# Patient Record
Sex: Female | Born: 1960 | Race: White | Hispanic: No | Marital: Married | Smoking: Current every day smoker
Health system: Southern US, Community
[De-identification: ages and names within clinical notes are randomized; demographics above are authoritative.]

## PROBLEM LIST (undated history)

## (undated) DIAGNOSIS — R112 Nausea with vomiting, unspecified: Secondary | ICD-10-CM

## (undated) DIAGNOSIS — E041 Nontoxic single thyroid nodule: Secondary | ICD-10-CM

## (undated) DIAGNOSIS — M1990 Unspecified osteoarthritis, unspecified site: Secondary | ICD-10-CM

## (undated) DIAGNOSIS — Z9889 Other specified postprocedural states: Secondary | ICD-10-CM

## (undated) DIAGNOSIS — M19012 Primary osteoarthritis, left shoulder: Secondary | ICD-10-CM

## (undated) HISTORY — PX: CHOLECYSTECTOMY: SHX55

## (undated) HISTORY — PX: SHOULDER ARTHROSCOPY: SHX128

## (undated) HISTORY — PX: JOINT REPLACEMENT: SHX530

## (undated) HISTORY — PX: ABDOMINAL HYSTERECTOMY: SHX81

## (undated) HISTORY — PX: BREAST SURGERY: SHX581

---

## 1999-01-27 ENCOUNTER — Emergency Department (HOSPITAL_COMMUNITY): Admission: EM | Admit: 1999-01-27 | Discharge: 1999-01-27 | Payer: Self-pay

## 2000-01-27 ENCOUNTER — Other Ambulatory Visit: Admission: RE | Admit: 2000-01-27 | Discharge: 2000-01-27 | Payer: Self-pay | Admitting: Family Medicine

## 2015-11-17 ENCOUNTER — Other Ambulatory Visit: Payer: Self-pay | Admitting: Orthopedic Surgery

## 2015-11-17 DIAGNOSIS — M25512 Pain in left shoulder: Secondary | ICD-10-CM

## 2015-11-17 DIAGNOSIS — M19012 Primary osteoarthritis, left shoulder: Secondary | ICD-10-CM

## 2015-11-21 ENCOUNTER — Other Ambulatory Visit: Payer: Self-pay

## 2015-12-22 ENCOUNTER — Ambulatory Visit
Admission: RE | Admit: 2015-12-22 | Discharge: 2015-12-22 | Disposition: A | Discharge status: Home / Self Care | Payer: Worker's Compensation | Source: Ambulatory Visit | Attending: Orthopedic Surgery | Admitting: Orthopedic Surgery

## 2015-12-22 DIAGNOSIS — M19012 Primary osteoarthritis, left shoulder: Secondary | ICD-10-CM

## 2015-12-22 DIAGNOSIS — M25512 Pain in left shoulder: Secondary | ICD-10-CM

## 2016-01-13 ENCOUNTER — Other Ambulatory Visit: Payer: Self-pay | Admitting: Orthopedic Surgery

## 2016-01-16 ENCOUNTER — Encounter (HOSPITAL_BASED_OUTPATIENT_CLINIC_OR_DEPARTMENT_OTHER): Payer: Self-pay | Admitting: *Deleted

## 2016-01-16 ENCOUNTER — Encounter (HOSPITAL_BASED_OUTPATIENT_CLINIC_OR_DEPARTMENT_OTHER)
Admission: RE | Admit: 2016-01-16 | Discharge: 2016-01-16 | Disposition: A | Discharge status: Home / Self Care | Payer: Self-pay | Source: Ambulatory Visit | Attending: Orthopedic Surgery | Admitting: Orthopedic Surgery

## 2016-01-16 DIAGNOSIS — M19012 Primary osteoarthritis, left shoulder: Secondary | ICD-10-CM | POA: Insufficient documentation

## 2016-01-16 DIAGNOSIS — Z01812 Encounter for preprocedural laboratory examination: Secondary | ICD-10-CM | POA: Insufficient documentation

## 2016-01-16 DIAGNOSIS — Z01818 Encounter for other preprocedural examination: Secondary | ICD-10-CM | POA: Insufficient documentation

## 2016-01-16 LAB — BASIC METABOLIC PANEL
ANION GAP: 7 (ref 5–15)
BUN: 10 mg/dL (ref 6–20)
CHLORIDE: 101 mmol/L (ref 101–111)
CO2: 29 mmol/L (ref 22–32)
Calcium: 9.2 mg/dL (ref 8.9–10.3)
Creatinine, Ser: 0.94 mg/dL (ref 0.44–1.00)
GFR calc Af Amer: >60 mL/min (ref >60)
Glucose, Bld: 95 mg/dL (ref 65–99)
POTASSIUM: 3.9 mmol/L (ref 3.5–5.1)
SODIUM: 137 mmol/L (ref 135–145)

## 2016-01-16 LAB — CBC
HEMATOCRIT: 37.4 % (ref 36.0–46.0)
HEMOGLOBIN: 12.2 g/dL (ref 12.0–15.0)
MCH: 30 pg (ref 26.0–34.0)
MCHC: 32.6 g/dL (ref 30.0–36.0)
MCV: 92.1 fL (ref 78.0–100.0)
Platelets: 290 10*3/uL (ref 150–400)
RBC: 4.06 MIL/uL (ref 3.87–5.11)
RDW: 12.7 % (ref 11.5–15.5)
WBC: 10.2 10*3/uL (ref 4.0–10.5)

## 2016-01-16 LAB — SURGICAL PCR SCREEN
MRSA, PCR: NEGATIVE
STAPHYLOCOCCUS AUREUS: NEGATIVE

## 2016-01-30 ENCOUNTER — Encounter (HOSPITAL_BASED_OUTPATIENT_CLINIC_OR_DEPARTMENT_OTHER)
Admission: RE | Disposition: A | Discharge status: Home / Self Care | Payer: Self-pay | Source: Ambulatory Visit | Attending: Orthopedic Surgery

## 2016-01-30 ENCOUNTER — Encounter (HOSPITAL_BASED_OUTPATIENT_CLINIC_OR_DEPARTMENT_OTHER): Payer: Self-pay | Admitting: Anesthesiology

## 2016-01-30 ENCOUNTER — Ambulatory Visit (HOSPITAL_BASED_OUTPATIENT_CLINIC_OR_DEPARTMENT_OTHER): Payer: Worker's Compensation | Admitting: Anesthesiology

## 2016-01-30 ENCOUNTER — Ambulatory Visit (HOSPITAL_BASED_OUTPATIENT_CLINIC_OR_DEPARTMENT_OTHER)
Admission: RE | Admit: 2016-01-30 | Discharge: 2016-01-31 | Disposition: A | Discharge status: Home / Self Care | Payer: Worker's Compensation | Source: Ambulatory Visit | Attending: Orthopedic Surgery | Admitting: Orthopedic Surgery

## 2016-01-30 ENCOUNTER — Ambulatory Visit (HOSPITAL_COMMUNITY): Payer: Worker's Compensation

## 2016-01-30 DIAGNOSIS — F17210 Nicotine dependence, cigarettes, uncomplicated: Secondary | ICD-10-CM | POA: Diagnosis not present

## 2016-01-30 DIAGNOSIS — M19012 Primary osteoarthritis, left shoulder: Secondary | ICD-10-CM | POA: Diagnosis present

## 2016-01-30 DIAGNOSIS — Z96619 Presence of unspecified artificial shoulder joint: Secondary | ICD-10-CM

## 2016-01-30 HISTORY — DX: Nausea with vomiting, unspecified: R11.2

## 2016-01-30 HISTORY — PX: TOTAL SHOULDER ARTHROPLASTY: SHX126

## 2016-01-30 HISTORY — DX: Other specified postprocedural states: Z98.890

## 2016-01-30 HISTORY — DX: Primary osteoarthritis, left shoulder: M19.012

## 2016-01-30 HISTORY — DX: Nontoxic single thyroid nodule: E04.1

## 2016-01-30 HISTORY — DX: Unspecified osteoarthritis, unspecified site: M19.90

## 2016-01-30 SURGERY — ARTHROPLASTY, SHOULDER, TOTAL
Anesthesia: General | Site: Shoulder | Laterality: Left

## 2016-01-30 MED ORDER — FENTANYL CITRATE (PF) 100 MCG/2ML IJ SOLN
INTRAMUSCULAR | Status: AC
  Filled 2016-01-30: qty 2

## 2016-01-30 MED ORDER — SENNA 8.6 MG PO TABS
1.0000 | ORAL_TABLET | Freq: Two times a day (BID) | ORAL | Status: DC
  Administered 2016-01-30: 8.6 mg via ORAL
  Filled 2016-01-30: qty 1

## 2016-01-30 MED ORDER — DOCUSATE SODIUM 100 MG PO CAPS
100.0000 mg | ORAL_CAPSULE | Freq: Two times a day (BID) | ORAL | Status: DC
  Administered 2016-01-30: 100 mg via ORAL
  Filled 2016-01-30: qty 1

## 2016-01-30 MED ORDER — POLYETHYLENE GLYCOL 3350 17 G PO PACK: 17.0000 g | PACK | Freq: Every day | ORAL | Status: DC | PRN

## 2016-01-30 MED ORDER — PHENYLEPHRINE HCL 10 MG/ML IJ SOLN
10.0000 mg | INTRAVENOUS | Status: DC | PRN
  Administered 2016-01-30: 50 ug/min via INTRAVENOUS

## 2016-01-30 MED ORDER — ADULT MULTIVITAMIN W/MINERALS CH: 1.0000 | ORAL_TABLET | Freq: Every day | ORAL | Status: DC

## 2016-01-30 MED ORDER — ONDANSETRON HCL 4 MG/2ML IJ SOLN
INTRAMUSCULAR | Status: AC
  Filled 2016-01-30: qty 2

## 2016-01-30 MED ORDER — SCOPOLAMINE 1 MG/3DAYS TD PT72
MEDICATED_PATCH | TRANSDERMAL | Status: AC
  Filled 2016-01-30: qty 1

## 2016-01-30 MED ORDER — HYDROCODONE-ACETAMINOPHEN 5-325 MG PO TABS
1.0000 | ORAL_TABLET | Freq: Four times a day (QID) | ORAL | Status: DC | PRN
  Administered 2016-01-30 – 2016-01-31 (×2): 1 via ORAL
  Filled 2016-01-30 (×2): qty 1

## 2016-01-30 MED ORDER — ONDANSETRON HCL 4 MG/2ML IJ SOLN
INTRAMUSCULAR | Status: DC | PRN
  Administered 2016-01-30: 4 mg via INTRAVENOUS

## 2016-01-30 MED ORDER — LACTATED RINGERS IV SOLN
INTRAVENOUS | Status: DC
  Administered 2016-01-30: 07:00:00 via INTRAVENOUS

## 2016-01-30 MED ORDER — CEFAZOLIN SODIUM 1-5 GM-% IV SOLN
1.0000 g | Freq: Four times a day (QID) | INTRAVENOUS | Status: AC
  Administered 2016-01-30 – 2016-01-31 (×3): 1 g via INTRAVENOUS
  Filled 2016-01-30 (×3): qty 50

## 2016-01-30 MED ORDER — SODIUM CHLORIDE 0.9 % IR SOLN
Status: DC | PRN
  Administered 2016-01-30: 1000 mL

## 2016-01-30 MED ORDER — CEFAZOLIN SODIUM-DEXTROSE 2-3 GM-% IV SOLR
2.0000 g | INTRAVENOUS | Status: AC
  Administered 2016-01-30: 2 g via INTRAVENOUS

## 2016-01-30 MED ORDER — SODIUM CHLORIDE FLUSH 0.9 % IV SOLN
INTRAVENOUS | Status: AC
  Filled 2016-01-30: qty 10

## 2016-01-30 MED ORDER — MAGNESIUM CITRATE PO SOLN: 1.0000 | Freq: Once | ORAL | Status: DC | PRN

## 2016-01-30 MED ORDER — HYDROCODONE-ACETAMINOPHEN 5-325 MG PO TABS: 1.0000 | ORAL_TABLET | Freq: Four times a day (QID) | ORAL | Status: AC | PRN

## 2016-01-30 MED ORDER — DEXAMETHASONE SODIUM PHOSPHATE 4 MG/ML IJ SOLN
INTRAMUSCULAR | Status: DC | PRN
  Administered 2016-01-30: 10 mg via INTRAVENOUS

## 2016-01-30 MED ORDER — HYDROMORPHONE HCL 1 MG/ML IJ SOLN: 0.2500 mg | INTRAMUSCULAR | Status: DC | PRN

## 2016-01-30 MED ORDER — MEPERIDINE HCL 25 MG/ML IJ SOLN: 6.2500 mg | INTRAMUSCULAR | Status: DC | PRN

## 2016-01-30 MED ORDER — METHOCARBAMOL 1000 MG/10ML IJ SOLN: 500.0000 mg | Freq: Four times a day (QID) | INTRAVENOUS | Status: DC | PRN

## 2016-01-30 MED ORDER — ZOLPIDEM TARTRATE 5 MG PO TABS: 5.0000 mg | ORAL_TABLET | Freq: Every evening | ORAL | Status: DC | PRN

## 2016-01-30 MED ORDER — HYDROMORPHONE HCL 1 MG/ML IJ SOLN: 0.5000 mg | INTRAMUSCULAR | Status: DC | PRN

## 2016-01-30 MED ORDER — BISACODYL 10 MG RE SUPP: 10.0000 mg | Freq: Every day | RECTAL | Status: DC | PRN

## 2016-01-30 MED ORDER — DEXAMETHASONE SODIUM PHOSPHATE 10 MG/ML IJ SOLN
INTRAMUSCULAR | Status: AC
  Filled 2016-01-30: qty 1

## 2016-01-30 MED ORDER — GLYCOPYRROLATE 0.2 MG/ML IJ SOLN: 0.2000 mg | Freq: Once | INTRAMUSCULAR | Status: DC | PRN

## 2016-01-30 MED ORDER — ONDANSETRON HCL 4 MG PO TABS: 4.0000 mg | ORAL_TABLET | Freq: Three times a day (TID) | ORAL | Status: AC | PRN

## 2016-01-30 MED ORDER — MIDAZOLAM HCL 2 MG/2ML IJ SOLN
INTRAMUSCULAR | Status: AC
  Filled 2016-01-30: qty 2

## 2016-01-30 MED ORDER — SUCCINYLCHOLINE CHLORIDE 20 MG/ML IJ SOLN
INTRAMUSCULAR | Status: AC
  Filled 2016-01-30: qty 1

## 2016-01-30 MED ORDER — FENTANYL CITRATE (PF) 100 MCG/2ML IJ SOLN: 50.0000 ug | INTRAMUSCULAR | Status: DC | PRN

## 2016-01-30 MED ORDER — SUCCINYLCHOLINE CHLORIDE 20 MG/ML IJ SOLN
INTRAMUSCULAR | Status: DC | PRN
  Administered 2016-01-30: 100 mg via INTRAVENOUS

## 2016-01-30 MED ORDER — RIVAROXABAN 15 MG PO TABS: 15.0000 mg | ORAL_TABLET | Freq: Every day | ORAL | Status: AC

## 2016-01-30 MED ORDER — ONDANSETRON HCL 4 MG/2ML IJ SOLN: 4.0000 mg | Freq: Four times a day (QID) | INTRAMUSCULAR | Status: DC | PRN

## 2016-01-30 MED ORDER — PROPOFOL 500 MG/50ML IV EMUL
INTRAVENOUS | Status: AC
  Filled 2016-01-30: qty 50

## 2016-01-30 MED ORDER — PROPOFOL 10 MG/ML IV BOLUS
INTRAVENOUS | Status: DC | PRN
  Administered 2016-01-30: 20 mg via INTRAVENOUS
  Administered 2016-01-30: 150 mg via INTRAVENOUS
  Administered 2016-01-30: 20 mg via INTRAVENOUS

## 2016-01-30 MED ORDER — METHOCARBAMOL 500 MG PO TABS: 500.0000 mg | ORAL_TABLET | Freq: Four times a day (QID) | ORAL | Status: DC | PRN

## 2016-01-30 MED ORDER — METOCLOPRAMIDE HCL 5 MG/ML IJ SOLN: 5.0000 mg | Freq: Three times a day (TID) | INTRAMUSCULAR | Status: DC | PRN

## 2016-01-30 MED ORDER — METOCLOPRAMIDE HCL 5 MG PO TABS: 5.0000 mg | ORAL_TABLET | Freq: Three times a day (TID) | ORAL | Status: DC | PRN

## 2016-01-30 MED ORDER — CEFAZOLIN SODIUM-DEXTROSE 2-3 GM-% IV SOLR
INTRAVENOUS | Status: AC
  Filled 2016-01-30: qty 50

## 2016-01-30 MED ORDER — ONDANSETRON HCL 4 MG PO TABS: 4.0000 mg | ORAL_TABLET | Freq: Four times a day (QID) | ORAL | Status: DC | PRN

## 2016-01-30 MED ORDER — MIDAZOLAM HCL 2 MG/2ML IJ SOLN
1.0000 mg | INTRAMUSCULAR | Status: DC | PRN
  Administered 2016-01-30: 2 mg via INTRAVENOUS

## 2016-01-30 MED ORDER — CALCIUM CARBONATE 1250 (500 CA) MG PO CHEW: 1250.0000 mg | CHEWABLE_TABLET | Freq: Every day | ORAL | Status: DC

## 2016-01-30 MED ORDER — PHENYLEPHRINE HCL 10 MG/ML IJ SOLN
INTRAMUSCULAR | Status: AC
  Filled 2016-01-30: qty 1

## 2016-01-30 MED ORDER — LIDOCAINE HCL (CARDIAC) 20 MG/ML IV SOLN
INTRAVENOUS | Status: AC
  Filled 2016-01-30: qty 5

## 2016-01-30 MED ORDER — FENTANYL CITRATE (PF) 100 MCG/2ML IJ SOLN
25.0000 ug | INTRAMUSCULAR | Status: DC | PRN
  Administered 2016-01-30 (×2): 25 ug via INTRAVENOUS

## 2016-01-30 MED ORDER — SENNA-DOCUSATE SODIUM 8.6-50 MG PO TABS: 2.0000 | ORAL_TABLET | Freq: Every day | ORAL | Status: AC

## 2016-01-30 MED ORDER — SCOPOLAMINE 1 MG/3DAYS TD PT72
1.0000 | MEDICATED_PATCH | Freq: Once | TRANSDERMAL | Status: AC | PRN
  Administered 2016-01-30: 1 via TRANSDERMAL

## 2016-01-30 MED ORDER — FENTANYL CITRATE (PF) 100 MCG/2ML IJ SOLN
50.0000 ug | INTRAMUSCULAR | Status: AC | PRN
  Administered 2016-01-30: 25 ug via INTRAVENOUS
  Administered 2016-01-30: 100 ug via INTRAVENOUS
  Administered 2016-01-30: 50 ug via INTRAVENOUS

## 2016-01-30 MED ORDER — ONDANSETRON HCL 4 MG/2ML IJ SOLN: 4.0000 mg | Freq: Once | INTRAMUSCULAR | Status: DC | PRN

## 2016-01-30 MED ORDER — LIDOCAINE HCL (CARDIAC) 20 MG/ML IV SOLN
INTRAVENOUS | Status: DC | PRN
  Administered 2016-01-30: 100 mg via INTRAVENOUS

## 2016-01-30 MED ORDER — BACLOFEN 10 MG PO TABS: 10.0000 mg | ORAL_TABLET | Freq: Three times a day (TID) | ORAL | Status: AC

## 2016-01-30 MED ORDER — SODIUM CHLORIDE 0.9 % IV SOLN: INTRAVENOUS | Status: DC

## 2016-01-30 SURGICAL SUPPLY — 77 items
BLADE HEX COATED 2.75 (ELECTRODE) ×3 IMPLANT
BLADE SAW SAG 29X58X.64 (BLADE) ×3 IMPLANT
BLADE SURG 10 STRL SS (BLADE) ×3 IMPLANT
BLADE SURG 15 STRL LF DISP TIS (BLADE) ×2 IMPLANT
BLADE SURG 15 STRL SS (BLADE) ×6
BOWL SMART MIX CTS (DISPOSABLE) ×2 IMPLANT
CAPT SHLDR TOTAL 2 ×2 IMPLANT
CEMENT HV SMART SET (Cement) ×3 IMPLANT
CLOSURE STERI-STRIP 1/2X4 (GAUZE/BANDAGES/DRESSINGS) ×1
CLSR STERI-STRIP ANTIMIC 1/2X4 (GAUZE/BANDAGES/DRESSINGS) ×2 IMPLANT
COVER BACK TABLE 60X90IN (DRAPES) ×3 IMPLANT
COVER MAYO STAND STRL (DRAPES) ×3 IMPLANT
DECANTER SPIKE VIAL GLASS SM (MISCELLANEOUS) IMPLANT
DRAPE IMP U-DRAPE 54X76 (DRAPES) ×3 IMPLANT
DRAPE INCISE IOBAN 66X45 STRL (DRAPES) IMPLANT
DRAPE SURG 17X23 STRL (DRAPES) ×3 IMPLANT
DRAPE U-SHAPE 47X51 STRL (DRAPES) ×3 IMPLANT
DRAPE U-SHAPE 76X120 STRL (DRAPES) ×6 IMPLANT
DRSG MEPILEX BORDER 4X8 (GAUZE/BANDAGES/DRESSINGS) ×3 IMPLANT
DURAPREP 26ML APPLICATOR (WOUND CARE) ×3 IMPLANT
ELECT BLADE 6.5 .24CM SHAFT (ELECTRODE) IMPLANT
ELECT REM PT RETURN 9FT ADLT (ELECTROSURGICAL) ×3
ELECTRODE REM PT RTRN 9FT ADLT (ELECTROSURGICAL) ×1 IMPLANT
FACESHIELD WRAPAROUND (MASK) ×6 IMPLANT
FACESHIELD WRAPAROUND OR TEAM (MASK) ×2 IMPLANT
GLOVE BIO SURGEON STRL SZ8 (GLOVE) ×3 IMPLANT
GLOVE BIOGEL PI IND STRL 7.0 (GLOVE) IMPLANT
GLOVE BIOGEL PI IND STRL 7.5 (GLOVE) IMPLANT
GLOVE BIOGEL PI IND STRL 8 (GLOVE) ×2 IMPLANT
GLOVE BIOGEL PI INDICATOR 7.0 (GLOVE) ×4
GLOVE BIOGEL PI INDICATOR 7.5 (GLOVE) ×2
GLOVE BIOGEL PI INDICATOR 8 (GLOVE) ×4
GLOVE ECLIPSE 6.5 STRL STRAW (GLOVE) ×2 IMPLANT
GLOVE ECLIPSE 7.0 STRL STRAW (GLOVE) ×2 IMPLANT
GLOVE ORTHO TXT STRL SZ7.5 (GLOVE) ×3 IMPLANT
GOWN STRL REUS W/ TWL LRG LVL3 (GOWN DISPOSABLE) ×1 IMPLANT
GOWN STRL REUS W/ TWL XL LVL3 (GOWN DISPOSABLE) ×2 IMPLANT
GOWN STRL REUS W/TWL LRG LVL3 (GOWN DISPOSABLE) ×3
GOWN STRL REUS W/TWL XL LVL3 (GOWN DISPOSABLE) ×6
HANDPIECE INTERPULSE COAX TIP (DISPOSABLE) ×3
NS IRRIG 1000ML POUR BTL (IV SOLUTION) ×3 IMPLANT
PACK ARTHROSCOPY DSU (CUSTOM PROCEDURE TRAY) ×3 IMPLANT
PACK BASIN DAY SURGERY FS (CUSTOM PROCEDURE TRAY) ×3 IMPLANT
PENCIL BUTTON HOLSTER BLD 10FT (ELECTRODE) ×3 IMPLANT
RETRIEVER SUT HEWSON (MISCELLANEOUS) IMPLANT
SET HNDPC FAN SPRY TIP SCT (DISPOSABLE) ×1 IMPLANT
SHEET MEDIUM DRAPE 40X70 STRL (DRAPES) ×3 IMPLANT
SLEEVE SCD COMPRESS KNEE MED (MISCELLANEOUS) ×3 IMPLANT
SLING ARM FOAM STRAP LRG (SOFTGOODS) IMPLANT
SLING ARM IMMOBILIZER LRG (SOFTGOODS) IMPLANT
SLING ARM IMMOBILIZER MED (SOFTGOODS) IMPLANT
SLING ARM MED ADULT FOAM STRAP (SOFTGOODS) IMPLANT
SLING ARM XL FOAM STRAP (SOFTGOODS) IMPLANT
SMARTMIX MINI TOWER (MISCELLANEOUS) ×6
SPONGE LAP 18X18 X RAY DECT (DISPOSABLE) ×3 IMPLANT
SPONGE LAP 4X18 X RAY DECT (DISPOSABLE) ×3 IMPLANT
SUCTION FRAZIER HANDLE 10FR (MISCELLANEOUS) ×2
SUCTION TUBE FRAZIER 10FR DISP (MISCELLANEOUS) ×1 IMPLANT
SUPPORT WRAP ARM LG (MISCELLANEOUS) ×3 IMPLANT
SUT FIBERWIRE #2 38 T-5 BLUE (SUTURE) ×24
SUT MNCRL AB 4-0 PS2 18 (SUTURE) IMPLANT
SUT VIC AB 0 CT1 18XCR BRD 8 (SUTURE) IMPLANT
SUT VIC AB 0 CT1 27 (SUTURE) ×3
SUT VIC AB 0 CT1 27XBRD ANBCTR (SUTURE) ×1 IMPLANT
SUT VIC AB 0 CT1 8-18 (SUTURE)
SUT VIC AB 2-0 SH 27 (SUTURE)
SUT VIC AB 2-0 SH 27XBRD (SUTURE) IMPLANT
SUT VICRYL 3-0 CR8 SH (SUTURE) ×3 IMPLANT
SUTURE FIBERWR #2 38 T-5 BLUE (SUTURE) ×2 IMPLANT
SYR BULB IRRIGATION 50ML (SYRINGE) ×3 IMPLANT
TAPE STRIPS DRAPE STRL (GAUZE/BANDAGES/DRESSINGS) IMPLANT
TOWEL OR 17X24 6PK STRL BLUE (TOWEL DISPOSABLE) ×6 IMPLANT
TOWEL OR NON WOVEN STRL DISP B (DISPOSABLE) ×6 IMPLANT
TOWER SMARTMIX MINI (MISCELLANEOUS) ×1 IMPLANT
TUBE CONNECTING 20'X1/4 (TUBING)
TUBE CONNECTING 20X1/4 (TUBING) IMPLANT
YANKAUER SUCT BULB TIP NO VENT (SUCTIONS) ×3 IMPLANT

## 2016-01-30 NOTE — Transfer of Care (Signed)
Immediate Anesthesia Transfer of Care Note  Patient: Rita Dennis  Procedure(s) Performed: Procedure(s): LEFT TOTAL SHOULDER ARTHROPLASTY (Left)  Patient Location: PACU  Anesthesia Type:General  Level of Consciousness: awake and sedated  Airway & Oxygen Therapy: Patient Spontanous Breathing and Patient connected to face mask oxygen  Post-op Assessment: Report given to RN and Post -op Vital signs reviewed and stable  Post vital signs: Reviewed and stable  Last Vitals:  Filed Vitals:   01/30/16 1007 01/30/16 1008  BP: 113/75   Pulse:  88  Temp:    Resp:  12    Complications: No apparent anesthesia complications

## 2016-01-30 NOTE — Op Note (Signed)
01/30/2016  9:43 AM  PATIENT:  Rita Dennis    PRE-OPERATIVE DIAGNOSIS:  LEFT SHOULDER OSTEOARTHRITIS  POST-OPERATIVE DIAGNOSIS:  Left shoulder primary localized osteoarthritis with retained hardware interfering with shoulder arthroplasty  PROCEDURE:  LEFT TOTAL SHOULDER ARTHROPLASTY with removal of hardware, deep humerus  SURGEON:  Eulas Post, MD  PHYSICIAN ASSISTANT: Janace Litten, OPA-C, present and scrubbed throughout the case, critical for completion in a timely fashion, and for retraction, instrumentation, and closure.  ANESTHESIA:   General  PREOPERATIVE INDICATIONS:  Rita Dennis is a  55 y.o. female with a diagnosis of LEFT SHOULDER OSTEOARTHRITIS who failed conservative measures and elected for surgical management.  She had a work-related injury that initiated a cascade of symptoms that ultimately failed arthroscopic intervention, and during arthroscopy was found to have extensive grade 4 chondral changes on the humeral head as well as the some degree on the glenoid.  The risks benefits and alternatives were discussed with the patient preoperatively including but not limited to the risks of infection, bleeding, nerve injury, cardiopulmonary complications, the need for revision surgery, dislocation, loosening, incomplete relief of pain, among others, and the patient was willing to proceed.   OPERATIVE IMPLANTS: Biomet size 9 mini press-fit humeral stem, size 42+18 Versa-dial humeral head, set in the he position with increased coverage posteriorly, with a small cemented glenoid polyethylene 3 peg implant with a central regenerex noncemented post.   OPERATIVE FINDINGS: Advanced glenohumeral osteoarthritis involving the humeral head, and to a lesser degree the glenoid. There were a small amount of osteophytes at the anterior aspect of the neck, although not substantial hypertrophic osteophyte formation. The chondral changes on the humeral head actually were quite impressive. These  were much more significant than was indicated on her plain x-rays and even her CAT scan.   OPERATIVE PROCEDURE: The patient was brought to the operating room and placed in the supine position. General anesthesia was administered. IV antibiotics were given.  The upper extremity was prepped and draped in usual sterile fashion. The patient was in a beachchair position with all bony prominences padded.   Time out was performed and a deltopectoral approach was carried out. The biceps tendon had already undergone tenodesis. The subscapularis was released, tagging it with a #2 FiberWire, leaving a cuff of tendon for repair.   The anterior osteophyte was removed, and release of the capsule off of the humeral side was completed. The head was dislocated, and I reamed sequentially. During the reaming process, the intramedullary reamer was engaging upon the metallic biceps tenodesis anchor. It was obstructing my ability to prepare the proximal humerus. Therefore I stopped with a slightly smaller reamer, using a 7, and then assembled my jig, with the humeral cutting guide at 30 of retroversion, and then pinned this into place, and made my humeral neck cut. This was at the appropriate level. I then reached down inside the canal with a "mother-in-law" grasper, and was able to remove the biceps tenodesis anchor in entirety. I then progressively reamed up again to a 9 which was the appropriate size.  I then placed deep retractors and exposed the glenoid. There was not much labrum to excise, and the biceps tendon was gone, and so I had full exposure.   I then placed a guidewire into the center position, controlling appropriate version and inclination. I then reamed over the guidewire with the small reamer, and was satisfied with the preparation. I preserved the subchondral bone in order to maximize the strength and minimize the risk  for subsequent subsidence.   I then drilled the central hole for the regenerex peg, and  then placed the guide, and then drilled the 3 peripheral peg holes. I had excellent bony circumferential contact. All 4 drill holes were within bone of the vault.  I then cleaned the glenoid, irrigated it copiously, and then dried it and cemented the prosthesis into place. Excellent seating was achieved. I had full exposure. The cement cured, and then I turned my attention to the humeral side.   I sequentially broached, up to the selected size, with the broach set at 30 of retroversion. I then placed the real stem. I trialed with multiple heads, and the above-named component was selected. Increased posterior coverage improved the coverage. The soft tissue tension was appropriate.   I then impacted the real humeral head into place, reduced the head, and irrigated copiously. Excellent stability and range of motion was achieved. I repaired the subscapularis with 4 #2 FiberWire, as well as the rotator interval, and irrigated copiously once more. I did not have that much of a cuff of tissue laterally, so I went through openings in the cortex which I created with a bone awl. The subcutaneous tissue was closed with Vicryl including the deltopectoral fascia.   The skin was closed with Steri-Strips and sterile gauze was applied. She had a preoperative nerve block. She tolerated the procedure well and there were no complications.

## 2016-01-30 NOTE — Discharge Instructions (Signed)

## 2016-01-30 NOTE — Anesthesia Procedure Notes (Addendum)
Anesthesia Regional Block:  Interscalene brachial plexus block  Pre-Anesthetic Checklist: ,, timeout performed, Correct Patient, Correct Site, Correct Laterality, Correct Procedure, Correct Position, site marked, Risks and benefits discussed,  Surgical consent,  Pre-op evaluation,  At surgeon's request and post-op pain management  Laterality: Left  Prep: chloraprep       Needles:  Injection technique: Single-shot  Needle Type: Echogenic Stimulator Needle     Needle Length: 9cm 9 cm Needle Gauge: 21 and 21 G    Additional Needles:  Procedures: ultrasound guided (picture in chart) and nerve stimulator Interscalene brachial plexus block  Nerve Stimulator or Paresthesia:  Response: 0.4 mA,   Additional Responses:   Narrative:  Start time: 01/30/2016 7:35 AM End time: 01/30/2016 7:45 AM Injection made incrementally with aspirations every 5 mL.  Performed by: Personally  Anesthesiologist: Arta Bruce  Additional Notes: Monitors applied. Patient sedated. Sterile prep and drape,hand hygiene and sterile gloves were used. Relevant anatomy identified.Needle position confirmed.Local anesthetic injected incrementally after negative aspiration. Local anesthetic spread visualized around nerve(s). Vascular puncture avoided. No complications. Image printed for medical record.The patient tolerated the procedure well.        Procedure Name: Intubation Performed by: York Grice Pre-anesthesia Checklist: Patient identified, Emergency Drugs available, Suction available and Patient being monitored Patient Re-evaluated:Patient Re-evaluated prior to inductionOxygen Delivery Method: Circle System Utilized Preoxygenation: Pre-oxygenation with 100% oxygen Intubation Type: IV induction Ventilation: Mask ventilation without difficulty Laryngoscope Size: Miller and 2 Grade View: Grade I Tube type: Oral Tube size: 7.0 mm Number of attempts: 1 Airway Equipment and Method: Stylet and Oral  airway Placement Confirmation: ETT inserted through vocal cords under direct vision,  positive ETCO2 and breath sounds checked- equal and bilateral Secured at: 22 cm Tube secured with: Tape Dental Injury: Teeth and Oropharynx as per pre-operative assessment

## 2016-01-30 NOTE — H&P (Signed)
PREOPERATIVE H&P  Chief Complaint: LEFT SHOULDER OSTEOARTHRITIS  HPI: Rita Dennis is a 55 y.o. female who presents for preoperative history and physical with a diagnosis of LEFT SHOULDER OSTEOARTHRITIS. Symptoms are rated as moderate to severe, and have been worsening.  This is significantly impairing activities of daily living.  She has elected for surgical management.   She has failed previous injections, activity modification, exercises, anti-inflammatories, as well as previous arthroscopic debridement. Pain is still moderate to severe on a daily basis limiting her job functions and activities of daily living.  Past Medical History  Diagnosis Date  . Arthritis     OA  . PONV (postoperative nausea and vomiting)   . Thyroid nodule     benign   Past Surgical History  Procedure Laterality Date  . Abdominal hysterectomy    . Cholecystectomy    . Breast surgery      Bil Breast Reduction  . Joint replacement Left   . Shoulder arthroscopy      x3   Social History   Social History  . Marital Status: Married    Spouse Name: N/A  . Number of Children: N/A  . Years of Education: N/A   Social History Main Topics  . Smoking status: Current Every Day Smoker -- 0.50 packs/day  . Smokeless tobacco: None  . Alcohol Use: No  . Drug Use: None  . Sexual Activity: Not Asked   Other Topics Concern  . None   Social History Narrative   History reviewed. No pertinent family history. No Known Allergies Prior to Admission medications   Medication Sig Start Date End Date Taking? Authorizing Provider  calcium carbonate (OS-CAL) 1250 (500 Ca) MG chewable tablet Chew 1 tablet by mouth daily.   Yes Historical Provider, MD  ibuprofen (ADVIL,MOTRIN) 200 MG tablet Take 200 mg by mouth every 6 (six) hours as needed.   Yes Historical Provider, MD  Melatonin 10 MG TABS Take by mouth.   Yes Historical Provider, MD  Multiple Vitamin (MULTIVITAMIN WITH MINERALS) TABS tablet Take 1 tablet by mouth  daily.   Yes Historical Provider, MD     Positive ROS: All other systems have been reviewed and were otherwise negative with the exception of those mentioned in the HPI and as above.  Physical Exam: General: Alert, no acute distress Cardiovascular: No pedal edema Respiratory: No cyanosis, no use of accessory musculature GI: No organomegaly, abdomen is soft and non-tender Skin: No lesions in the area of chief complaint Neurologic: Sensation intact distally Psychiatric: Patient is competent for consent with normal mood and affect Lymphatic: No axillary or cervical lymphadenopathy  MUSCULOSKELETAL: Left shoulder has active motion 0-160 with external rotation to 25 in cuff strength intact with well-healed previous surgical wounds.  Assessment: LEFT SHOULDER OSTEOARTHRITIS   Plan: Plan for Procedure(s): LEFT TOTAL SHOULDER ARTHROPLASTY  The risks benefits and alternatives were discussed with the patient including but not limited to the risks of nonoperative treatment, versus surgical intervention including infection, bleeding, nerve injury,  blood clots, cardiopulmonary complications, morbidity, mortality, among others, and they were willing to proceed. We also had an in-depth discussion that this is a very aggressive intervention, and may lead to some degree of long-term risk for revision surgery, incomplete relief of pain, recurrent dysfunction, among others.  Eulas Post, MD Cell [phone]   01/30/2016 7:27 AM

## 2016-01-30 NOTE — Progress Notes (Signed)
Patient's BP in PACU was 107-101/70s.  Since arriving to [facility], patient's BP has been 90s/60s.  Patient is asymptomatic. No bleeding at incision site, no swelling, no dizziness, pulse is in the 80s, O2 saturation 97-99% RA.  Patient has walked in the [facility] halls and to [facility] with no problems. 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  Will continue to monitor.

## 2016-01-30 NOTE — Progress Notes (Signed)
AssistedDr. Ossey with left, ultrasound guided, interscalene  block. Side rails up, monitors on throughout procedure. See vital signs in flow sheet. Tolerated Procedure well.  

## 2016-01-30 NOTE — Anesthesia Preprocedure Evaluation (Signed)
Anesthesia Evaluation  Patient identified by MRN, date of birth, ID band Patient awake    Reviewed: Allergy & Precautions, NPO status , Patient's Chart, lab work & pertinent test results  History of Anesthesia Complications (+) PONV  Airway Mallampati: I  TM Distance: >3 FB Neck ROM: Full    Dental   Pulmonary Current Smoker,    Pulmonary exam normal        Cardiovascular Normal cardiovascular exam     Neuro/Psych    GI/Hepatic   Endo/Other    Renal/GU      Musculoskeletal   Abdominal   Peds  Hematology   Anesthesia Other Findings   Reproductive/Obstetrics                             Anesthesia Physical Anesthesia Plan  ASA: II  Anesthesia Plan: General   Post-op Pain Management: GA combined w/ Regional for post-op pain   Induction: Intravenous  Airway Management Planned: Oral ETT  Additional Equipment:   Intra-op Plan:   Post-operative Plan: Extubation in OR  Informed Consent: I have reviewed the patients History and Physical, chart, labs and discussed the procedure including the risks, benefits and alternatives for the proposed anesthesia with the patient or authorized representative who has indicated his/her understanding and acceptance.     Plan Discussed with: CRNA and Surgeon  Anesthesia Plan Comments:         Anesthesia Quick Evaluation

## 2016-01-30 NOTE — Anesthesia Postprocedure Evaluation (Signed)
Anesthesia Post Note  Patient: Rita Dennis  Procedure(s) Performed: Procedure(s) (LRB): LEFT TOTAL SHOULDER ARTHROPLASTY (Left)  Patient location during evaluation: PACU Anesthesia Type: General Level of consciousness: awake and alert Pain management: pain level controlled Vital Signs Assessment: post-procedure vital signs reviewed and stable Respiratory status: spontaneous breathing, nonlabored ventilation, respiratory function stable and patient connected to nasal cannula oxygen Cardiovascular status: blood pressure returned to baseline and stable Postop Assessment: no signs of nausea or vomiting Anesthetic complications: no    Last Vitals:  Filed Vitals:   01/30/16 1115 01/30/16 1215  BP: 107/74 97/63  Pulse: 78 80  Temp: 36.5 C 36.3 C  Resp: 18 16    Last Pain:  Filed Vitals:   01/30/16 1222  PainSc: 2                  Jenisse Vullo DAVID

## 2016-01-30 NOTE — Progress Notes (Signed)
Gave report to Lawson Fiscal, Charity fundraiser and Reuel Boom, Charity fundraiser

## 2016-01-31 DIAGNOSIS — M19012 Primary osteoarthritis, left shoulder: Secondary | ICD-10-CM | POA: Diagnosis not present

## 2016-02-02 ENCOUNTER — Encounter (HOSPITAL_BASED_OUTPATIENT_CLINIC_OR_DEPARTMENT_OTHER): Payer: Self-pay | Admitting: Orthopedic Surgery

## 2016-04-06 ENCOUNTER — Ambulatory Visit: Payer: Worker's Compensation | Attending: Orthopedic Surgery | Admitting: Physical Therapy

## 2016-04-06 DIAGNOSIS — M25612 Stiffness of left shoulder, not elsewhere classified: Secondary | ICD-10-CM

## 2016-04-06 DIAGNOSIS — M25512 Pain in left shoulder: Secondary | ICD-10-CM | POA: Diagnosis not present

## 2016-04-06 NOTE — Therapy (Signed)
[facility] [address] [place], [place], [address] Phone: [phone]   Fax:  [phone]  Physical Therapy Evaluation  Patient Details  Name: Rita Dennis MRN: [id number] Date of Birth: [birth year] Referring Provider: Teryl Lucy MD.  Encounter Date: 04/06/2016      PT End of Session - 04/06/16 1443    Visit Number 1   Number of Visits 16   Date for PT Re-Evaluation 06/01/16   PT Start Time 0153   PT Stop Time 0231   PT Time Calculation (min) 38 min   Activity Tolerance Patient tolerated treatment well   Behavior During Therapy [facility] for tasks assessed/performed      Past Medical History  Diagnosis Date  . Arthritis     OA  . PONV (postoperative nausea and vomiting)   . Thyroid nodule     benign  . Osteoarthritis of left shoulder 01/30/2016    Past Surgical History  Procedure Laterality Date  . Abdominal hysterectomy    . Cholecystectomy    . Breast surgery      Bil Breast Reduction  . Joint replacement Left   . Shoulder arthroscopy      x3  . Total shoulder arthroplasty Left 01/30/2016    Procedure: LEFT TOTAL SHOULDER ARTHROPLASTY;  Surgeon: Teryl Lucy, MD;  Location: [facility];  Service: Orthopedics;  Laterality: Left;    There were no vitals filed for this visit.       Subjective Assessment - 04/06/16 1514    Subjective I still have a stitch in my shoulder that bothers me.  I don't like TENS.   Pertinent History 2 previous left shoulder arthroscopic surgeries.   Patient Stated Goals I want to use my left arm without pain again.   Currently in Pain? No/denies  No pain at rest today.            [facility] PT Assessment - 04/06/16 0001    Assessment   Medical Diagnosis Left total shoulder replacement.   Referring Provider Teryl Lucy MD.   Onset Date/Surgical Date --  01/30/16 (surgery date).   Precautions   Precaution Comments Per total shoulder protocol.  No ultrasound.  Patient does  not like electrical stimulation.   Restrictions   Weight Bearing Restrictions No   Balance Screen   Has the patient fallen in the past 6 months No   Has the patient had a decrease in activity level because of a fear of falling?  No   Is the patient reluctant to leave their home because of a fear of falling?  No   Home Tourist information centre manager residence   Prior Function   Level of Independence Independent   ROM / Strength   AROM / PROM / Strength AROM;Strength   AROM   Overall AROM Comments In supine the patient demonstrated left shoulder active-assisitive left shoulder flexion to 133 egrees and IR/ER= 45 degrees.   Strength   Overall Strength Comments Left shoulder IR/ER grossly assessed at 4-/5.   Palpation   Palpation comment Patient CC is that of elbow pain though not palpable and c/o left 4th and 5th finger numbness and tingling.                   [facility] Adult PT Treatment/Exercise - 04/06/16 0001    Exercises   Exercises Shoulder   Manual Therapy   Manual therapy comments In supine AAROM into left shoulder flexion and ER and rhy  stabs at 90 degrees x 8 minutes.                     PT Long Term Goals - 04/06/16 1728    PT LONG TERM GOAL #1   Title Ind with an advanced HEP.   Time 8   Period Weeks   Status New   PT LONG TERM GOAL #2   Title Active left shoulder flexion to 150 degrees so the patient can easily reach overhead   Time 8   Period Weeks   Status New   PT LONG TERM GOAL #3   Title Active ER to 70 degrees+ to allow for easily donning/doffing of apparel   Time 8   Period Weeks   Status New   PT LONG TERM GOAL #4   Title Increase ROM so patient is able to reach behind back to L3.   Time 8   Period Weeks   Status New   PT LONG TERM GOAL #5   Title Increase left shoulder strength to a solid 4+/5 to increase stability for performance of functional activities   Time 8   Period Weeks   Status New   Additional Long Term  Goals   Additional Long Term Goals Yes   PT LONG TERM GOAL #6   Title Perform ADL's with pain not > 3/10.   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  She underwent 2 arthroscopic surgeries in 2016 but continued to have and and eventually underwent a left total shoulder replacement on 01/30/16.  She reports no pain at rest today but higher pain-levels (3-4+/10) with left shoulder range of motion.  She that she has had elbow pain, however, and experiences tingling and numbness over her left 4th and 5th fingers.  She states she has been compliant with a hone pulley system and has been using theraband for strengthening.  She states that she still has a stitch remaining that is  irritating.   Rehab Potential Excellent   PT Frequency 2x / week   PT Duration 8 weeks   PT Treatment/Interventions ADLs/Self Care Home Management;Cryotherapy;Electrical Stimulation;Moist Heat;Therapeutic exercise;Therapeutic activities;Patient/family education;Manual techniques;Passive range of motion   PT Next Visit Plan Please follow total shoulder protocol.  AAROM; rhy stabs.  No ultrasound or e'stim.  UE Ranger.   Consulted and Agree with Plan of Care Patient      Patient will benefit from skilled therapeutic intervention in order to improve the following deficits and impairments:  Decreased activity tolerance, Pain, Decreased range of motion, Decreased strength  Visit Diagnosis: Pain in left shoulder - Plan: PT plan of care cert/re-cert  Stiffness of left shoulder, not elsewhere classified - Plan: PT plan of care cert/re-cert     Problem List Patient Active Problem List   Diagnosis Date Noted  . Osteoarthritis of left shoulder 01/30/2016  . Localized primary osteoarthritis of left shoulder region 01/30/2016    Kristyne Woodring, [place]  MPT 04/06/2016, 5:40 PM  [facility] [address] [place], [place], [address] Phone: [phone]   Fax:  [phone]  Name: Harlow Mareseresa Bas MRN: [id number] Date of Birth: 03/06/1961

## 2016-04-08 ENCOUNTER — Encounter: Payer: Self-pay | Admitting: Physical Therapy

## 2016-04-08 ENCOUNTER — Ambulatory Visit: Payer: Worker's Compensation | Admitting: Physical Therapy

## 2016-04-08 DIAGNOSIS — M25512 Pain in left shoulder: Secondary | ICD-10-CM | POA: Diagnosis not present

## 2016-04-08 DIAGNOSIS — M25612 Stiffness of left shoulder, not elsewhere classified: Secondary | ICD-10-CM

## 2016-04-08 NOTE — Therapy (Signed)
[facility] [address] [place], [place], [address] Phone: [phone]   Fax:  [phone]  Physical Therapy Treatment  Patient Details  Name: Rita Dennis MRN: [id number] Date of Birth: 1961/02/24 Referring Provider: Teryl Lucy MD.  Encounter Date: 04/08/2016      PT End of Session - 04/08/16 1351    Visit Number 2   Number of Visits 16   Date for PT Re-Evaluation 06/01/16   PT Start Time 1350   PT Stop Time 1435   PT Time Calculation (min) 45 min   Activity Tolerance Patient tolerated treatment well   Behavior During Therapy [facility] for tasks assessed/performed      Past Medical History  Diagnosis Date  . Arthritis     OA  . PONV (postoperative nausea and vomiting)   . Thyroid nodule     benign  . Osteoarthritis of left shoulder 01/30/2016    Past Surgical History  Procedure Laterality Date  . Abdominal hysterectomy    . Cholecystectomy    . Breast surgery      Bil Breast Reduction  . Joint replacement Left   . Shoulder arthroscopy      x3  . 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      Subjective Assessment - 04/08/16 1351    Subjective Reports that most of pain is in L elbow region but shoulder joint feels good. Reports that she did have a blue or green theraband that her daughter took and she did not use very much but had been using a yellow theraband.   Pertinent History 2 previous left shoulder arthroscopic surgeries.   Patient Stated Goals I want to use my left arm without pain again.   Currently in Pain? Yes   Pain Score 2    Pain Location Elbow   Pain Orientation Left            [facility] PT Assessment - 04/08/16 0001    Assessment   Medical Diagnosis Left total shoulder replacement.   Onset  Date/Surgical Date 01/30/16   Next MD Visit 04/26/2016   Precautions   Precaution Comments Per total shoulder protocol.  No ultrasound.  Patient does not like electrical stimulation.   Restrictions   Weight Bearing Restrictions No                     [facility] Adult PT Treatment/Exercise - 04/08/16 0001    Shoulder Exercises: Supine   Protraction Strengthening;Left;20 reps;Weights   Protraction Weight (lbs) 1   Shoulder Exercises: Prone   Retraction Strengthening;Left;20 reps;Weights   Retraction Weight (lbs) 2   Extension Strengthening;Left;20 reps;Weights   Extension Weight (lbs) 2   Shoulder Exercises: Sidelying   External Rotation Strengthening;Left;20 reps;Weights   External Rotation Weight (lbs) 1   Internal Rotation Strengthening;Left;20 reps;Weights   Internal Rotation Weight (lbs) 1   Shoulder Exercises: Standing   External Rotation Strengthening;Left;20 reps;Theraband   Theraband Level (Shoulder External Rotation) Level 2 (Red)   Internal Rotation Strengthening;Left;20 reps;Theraband   Theraband Level (Shoulder Internal Rotation) Level 2 (Red)   Flexion Strengthening;Both;20 reps;Weights   Shoulder Flexion Weight (lbs) 1   Extension Strengthening;Left;20 reps;Theraband   Theraband Level (Shoulder Extension) Level 2 (Red)   Row Strengthening;Left;20 reps;Theraband   Theraband Level (Shoulder Row)  Level 2 (Red)   Other Standing Exercises B scapular depression red theraband x20 reps   Other Standing Exercises B shoulder scaption 1# x20 reps   Shoulder Exercises: Pulleys   Other Pulley Exercises Standing LUE ranger flex, circles x 20 reps   Shoulder Exercises: ROM/Strengthening   UBE (Upper Arm Bike) 120 RPM x6 min   Modalities   Modalities Vasopneumatic   Vasopneumatic   Number Minutes Vasopneumatic  15 minutes   Vasopnuematic Location  Shoulder   Vasopneumatic Pressure Medium   Vasopneumatic Temperature  70                PT Education -  04/08/16 1438    Education provided Yes   Education Details HEP- resisted ER/IR/ row/ extension   Person(s) Educated Patient   Methods Explanation;Demonstration;Verbal cues;Handout   Comprehension Verbalized understanding;Returned demonstration;Verbal cues required             PT Long Term Goals - 04/06/16 1728    PT LONG TERM GOAL #1   Title Ind with an advanced HEP.   Time 8   Period Weeks   Status New   PT LONG TERM GOAL #2   Title Active left shoulder flexion to 150 degrees so the patient can easily reach overhead   Time 8   Period Weeks   Status New   PT LONG TERM GOAL #3   Title Active ER to 70 degrees+ to allow for easily donning/doffing of apparel   Time 8   Period Weeks   Status New   PT LONG TERM GOAL #4   Title Increase ROM so patient is able to reach behind back to L3.   Time 8   Period Weeks   Status New   PT LONG TERM GOAL #5   Title Increase left shoulder strength to a solid 4+/5 to increase stability for performance of functional activities   Time 8   Period Weeks   Status New   Additional Long Term Goals   Additional Long Term Goals Yes   PT LONG TERM GOAL #6   Title Perform ADL's with pain not > 3/10.   Time 8   Period Weeks   Status New               Plan - 04/08/16 1424    Clinical Impression Statement Patient tolerated today's treatment fairly well although she continued to experience L elbow discomfort with exercises. Patient notes that most L elbow discomfort occurs with elbow flexion. Tolerated red theraband exercises well although she reported an unpleasant sensation in anterior L shoulder. No other complaints were verbalized other than L elbow discomfort. Low weight was utilized during today's treatment as this is the initation of PT. Patient experienced L shoulder popping with supine shoulder protraction with 1# weight. Accepted new HEP with red theraband and was educated regarding theraband placement in door jam for easy use.  Patient vebalized understanding regarding HEP education. Normal vasopneumtic response noted following removal of the system.  Experienced 4/10 L elbow pain upon end of treatment.   Rehab Potential Excellent   PT Frequency 2x / week   PT Duration 8 weeks   PT Treatment/Interventions ADLs/Self Care Home Management;Cryotherapy;Electrical Stimulation;Moist Heat;Therapeutic exercise;Therapeutic activities;Patient/family education;Manual techniques;Passive range of motion   PT Next Visit Plan Please follow total shoulder protocol.  AAROM; rhy stabs.  No ultrasound or e'stim.  UE Ranger.   PT Home Exercise Plan HEP- resisted ER/IR/Row/Ext with red theraband   Consulted and Agree  with Plan of Care Patient      Patient will benefit from skilled therapeutic intervention in order to improve the following deficits and impairments:  Decreased activity tolerance, Pain, Decreased range of motion, Decreased strength  Visit Diagnosis: Pain in left shoulder  Stiffness of left shoulder, not elsewhere classified     Problem List Patient Active Problem List   Diagnosis Date Noted  . Osteoarthritis of left shoulder 01/30/2016  . 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 [place], [place], [address] Phone: [phone]   Fax:  [phone]  Name: Rita Dennis MRN: [id number] Date of Birth: [birth year]

## 2016-04-08 NOTE — Patient Instructions (Signed)
Strengthening: Resisted External Rotation    Hold tubing in right hand, elbow at side and forearm across body. Rotate forearm out. Repeat __10__ times per set. Do _2-3___ sets per session. Do _2-3___ sessions per day.  http://orth.exer.us/828   Copyright  VHI. All rights reserved.  Strengthening: Resisted Internal Rotation    Hold tubing in left hand, elbow at side and forearm out. Rotate forearm in across body. Repeat __10__ times per set. Do _2-3___ sets per session. Do _2-3___ sessions per day.  http://orth.exer.us/830   Copyright  VHI. All rights reserved.  Strengthening: Resisted Extension    Hold tubing in right hand, arm forward. Pull arm back, elbow straight. Repeat __10__ times per set. Do __2-3__ sets per session. Do __2-3__ sessions per day.  http://orth.exer.us/832   Copyright  VHI. All rights reserved.  Rowing: Resisted (Sitting)    Long-sit with resistive band around feet, hands firmly holding ends. Pull elbows back. Repeat __10__ times per set. Do __2-3__ sets per session. Do __2-3__ sessions per day.  http://orth.exer.us/184   Copyright  VHI. All rights reserved.   

## 2016-04-12 ENCOUNTER — Encounter: Payer: Self-pay | Admitting: Physical Therapy

## 2016-04-12 ENCOUNTER — Ambulatory Visit: Payer: Worker's Compensation | Admitting: Physical Therapy

## 2016-04-12 DIAGNOSIS — M25612 Stiffness of left shoulder, not elsewhere classified: Secondary | ICD-10-CM

## 2016-04-12 DIAGNOSIS — M25512 Pain in left shoulder: Secondary | ICD-10-CM | POA: Diagnosis not present

## 2016-04-12 NOTE — Therapy (Signed)
[facility] [address] [place], [place], [address] Phone: [phone]   Fax:  [phone]  Physical Therapy Treatment  Patient Details  Name: Rita Dennis MRN: [id number] Date of Birth: 06/30/1961 Referring Provider: Teryl LucyJoshua Landau MD.  Encounter Date: 04/12/2016      PT End of Session - 04/12/16 1441    Visit Number 3   Number of Visits 16   Date for PT Re-Evaluation 06/01/16   PT Start Time 1346   PT Stop Time 1435   PT Time Calculation (min) 49 min   Activity Tolerance Patient tolerated treatment well   Behavior [facility] Therapy [facility] for tasks assessed/performed      Past Medical History  Diagnosis Date  . Arthritis     OA  . PONV (postoperative nausea and vomiting)   . Thyroid nodule     benign  . Osteoarthritis of left shoulder 01/30/2016    Past Surgical History  Procedure Laterality Date  . Abdominal hysterectomy    . Cholecystectomy    . Breast surgery      Bil Breast Reduction  . Joint replacement Left   . Shoulder arthroscopy      x3  . 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      Subjective Assessment - 04/12/16 1356    Subjective Reports that L elbow is giving her trouble sitll. States that if she turns elbow a certain way that pain will shoot down her arm and last 2 fingers will go numb.   Pertinent History 2 previous left shoulder arthroscopic surgeries.   Patient Stated Goals I want to use my left arm without pain again.   Currently in Pain? Yes   Pain Score 3    Pain Location Elbow   Pain Orientation Left   Pain Radiating Towards down L arm with numbness in 4th and 5th phalange            [facility] - 04/12/16 0001    Assessment   Medical Diagnosis Left total shoulder replacement.    Onset Date/Surgical Date 01/30/16   Next MD Visit 04/26/2016   Precautions   Precaution Comments Per total shoulder protocol.  No ultrasound.  Patient does not like electrical stimulation.   Restrictions   Weight Bearing Restrictions No                     [facility] Adult PT Treatment/Exercise - 04/12/16 0001    Shoulder Exercises: Supine   Protraction Strengthening;Left;20 reps;Weights   Protraction Weight (lbs) 1   Shoulder Exercises: Prone   Extension Strengthening;Left;20 reps;Weights   Extension Weight (lbs) 2   Shoulder Exercises: Sidelying   External Rotation Strengthening;Left;20 reps;Weights   External Rotation Weight (lbs) 1   Internal Rotation Strengthening;Left;20 reps;Weights   Internal Rotation Weight (lbs) 1   Shoulder Exercises: Standing   Protraction Strengthening;Left;20 reps;Theraband   Theraband Level (Shoulder Protraction) Level 2 (Red)   External Rotation Strengthening;Left;20 reps;Theraband   Theraband Level (Shoulder External Rotation) Level 2 (Red)   Internal Rotation Strengthening;Left;20 reps;Theraband   Theraband Level (Shoulder Internal Rotation) Level 2 (Red)   Flexion Strengthening;Both;20 reps;Weights   Shoulder Flexion Weight (lbs) 1   Extension Strengthening;Left;20 reps;Theraband   Theraband Level (Shoulder Extension) Level 2 (Red)   Row Strengthening;Left;Theraband  x4 reps but discontinued secondary to pain   Theraband Level (Shoulder Row) Level 2 (Red)   Other Standing Exercises B scapular depression red theraband x20 reps   Other Standing Exercises B shoulder scaption 1# x20 reps, LUE wall slides x20 reps   Shoulder Exercises: Pulleys   Other Pulley Exercises Standing LUE ranger flex, circles x 20 reps   Shoulder Exercises: ROM/Strengthening   UBE (Upper Arm Bike) 120 RPM x6 min   Rhythmic Stabilization, Supine L shoulder rhythmic stabilizations in ER at 45 deg and 60 deg as well as flexion to improve R shouler stability    Modalities   Modalities Vasopneumatic   Vasopneumatic   Number Minutes Vasopneumatic  15 minutes   Vasopnuematic Location  Shoulder   Vasopneumatic Pressure Low   Vasopneumatic Temperature  54                     PT Long Term Goals - 04/06/16 1728    PT LONG TERM GOAL #1   Title Ind with an advanced HEP.   Time 8   Period Weeks   Status New   PT LONG TERM GOAL #2   Title Active left shoulder flexion to 150 degrees so the patient can easily reach overhead   Time 8   Period Weeks   Status New   PT LONG TERM GOAL #3   Title Active ER to 70 degrees+ to allow for easily donning/doffing of apparel   Time 8   Period Weeks   Status New   PT LONG TERM GOAL #4   Title Increase ROM so patient is able to reach behind back to L3.   Time 8   Period Weeks   Status New   PT LONG TERM GOAL #5   Title Increase left shoulder strength to a solid 4+/5 to increase stability for performance of functional activities   Time 8   Period Weeks   Status New   Additional Long Term Goals   Additional Long Term Goals Yes   PT LONG TERM GOAL #6   Title Perform ADL's with pain not > 3/10.   Time 8   Period Weeks   Status New               Plan - 04/12/16 1443    Clinical Impression Statement Patient tolerated today's treatment well although she is still limited due to L elbow pain. Patient reports that she has not been sleeping on LUE as she cannot at this time. Patient experienced increased L elbow pain with standing L shoulder row with red theraband and bent L shoulder row with 2#. No L shoulder discomfort or increased pain was reported to PTA by patient with exercises. Patient demonstrated good L shoulder stability with supine L shoulder rhythmic stabilizations in both ER and flexion. Normal vasopneumatic response noted following removal of the modality. Patient reports that she always experiences L shoulder soreness following PT treatments but gave no numerical rating for today's  soreness.   Rehab Potential Excellent   PT Frequency 2x / week   PT Duration 8 weeks   PT Treatment/Interventions ADLs/Self Care Home Management;Cryotherapy;Electrical Stimulation;Moist Heat;Therapeutic exercise;Therapeutic activities;Patient/family education;Manual techniques;Passive range of motion   PT Next Visit Plan Continue per MPT POC and total shoulder replacement protocol with vasopneumatic device for pain.   PT Home Exercise Plan HEP- resisted ER/IR/Row/Ext with red theraband   Consulted and Agree with Plan of Care Patient      Patient will  benefit from skilled therapeutic intervention in order to improve the following deficits and impairments:  Decreased activity tolerance, Pain, Decreased range of motion, Decreased strength  Visit Diagnosis: Pain in left shoulder  Stiffness of left shoulder, not elsewhere classified     Problem List Patient Active Problem List   Diagnosis Date Noted  . Osteoarthritis of left shoulder 01/30/2016  . 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

## 2016-04-15 ENCOUNTER — Encounter: Payer: Self-pay | Admitting: Physical Therapy

## 2016-04-27 ENCOUNTER — Encounter: Payer: Self-pay | Admitting: Physical Therapy

## 2016-04-29 ENCOUNTER — Encounter: Payer: Self-pay | Admitting: Physical Therapy

## 2016-05-04 ENCOUNTER — Encounter: Payer: Self-pay | Admitting: Physical Therapy

## 2016-05-04 ENCOUNTER — Ambulatory Visit: Payer: Worker's Compensation | Admitting: Physical Therapy

## 2016-05-04 DIAGNOSIS — M25512 Pain in left shoulder: Secondary | ICD-10-CM | POA: Diagnosis not present

## 2016-05-04 DIAGNOSIS — M25612 Stiffness of left shoulder, not elsewhere classified: Secondary | ICD-10-CM

## 2016-05-04 NOTE — Therapy (Addendum)
[facility] [place], [place], [address] Phone: [phone]   Fax:  [phone]  Physical Therapy Treatment  Patient Details  Name: Rita Dennis MRN: [id number] Date of Birth: [birth year] Referring Provider: Marchia Bond MD.  Encounter Date: 05/04/2016      PT End of Session - 05/04/16 1341    Visit Number 4   Number of Visits 16   Date for PT Re-Evaluation 06/01/16   PT Start Time 7902   PT Stop Time 1431   PT Time Calculation (min) 44 min   Activity Tolerance Patient tolerated treatment well   Behavior During Therapy [facility] for tasks assessed/performed      Past Medical History  Diagnosis Date  . Arthritis     OA  . PONV (postoperative nausea and vomiting)   . Thyroid nodule     benign  . Osteoarthritis of left shoulder 01/30/2016    Past Surgical History  Procedure Laterality Date  . Abdominal hysterectomy    . Cholecystectomy    . Breast surgery      Bil Breast Reduction  . Joint replacement Left   . Shoulder arthroscopy      x3  . 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      Subjective Assessment - 05/04/16 1341    Subjective Reports that no jobs will take her per her lifting limitations at 10#. Reports that elbow pain is not as bad and she spoke with MD regarding the elbow pain and states that inflammation is still present in elbow.    Pertinent History 2 previous left shoulder arthroscopic surgeries.   Patient Stated Goals I want to use my left arm without pain again.   Currently in Pain? No/denies            [facility] PT Assessment - 05/04/16 0001    Assessment   Medical Diagnosis Left total shoulder replacement.   Onset Date/Surgical Date 01/30/16   Next MD Visit 05/2016   Precautions   Precaution Comments Per total shoulder protocol.  No ultrasound.  Patient does not like electrical stimulation.   Restrictions   Weight Bearing Restrictions No   ROM / Strength   AROM / PROM / Strength AROM   AROM   Overall AROM  Deficits   AROM Assessment Site Shoulder   Right/Left Shoulder Left   Left Shoulder Flexion 145 Degrees                     [facility] Adult PT Treatment/Exercise - 05/04/16 0001    Shoulder Exercises: Prone   Retraction Strengthening;Left;Weights  3*10   Retraction Weight (lbs) 2   Extension Strengthening;Left;Weights  3*10   Extension Weight (lbs) 2   Shoulder Exercises: Sidelying   External Rotation Strengthening;Left;Weights  3*10   External Rotation Weight (lbs) 2   Internal Rotation Strengthening;Left;Weights  3*10   Internal Rotation Weight (lbs) 2   Shoulder Exercises: Standing   Protraction Strengthening;Left;Theraband  3*10   Theraband Level (Shoulder Protraction) Level 2 (Red)   External Rotation Strengthening;Left;Theraband  3*10 reps   Theraband Level (Shoulder External Rotation) Level 2 (Red)   Internal Rotation Strengthening;Left;Theraband  3*10 reps   Theraband Level (Shoulder Internal Rotation) Level 2 (Red)   Flexion Strengthening;Both;Weights  3*10   Shoulder Flexion  Weight (lbs) 1   Extension Strengthening;Left;Theraband  3*10   Theraband Level (Shoulder Extension) Level 2 (Red)   Row Strengthening;Left;Theraband  3*10   Theraband Level (Shoulder Row) Level 2 (Red)   Other Standing Exercises B scapular depression red theraband x30 reps   Other Standing Exercises B shoulder scaption 1# x30 reps   Shoulder Exercises: Pulleys   Other Pulley Exercises Standing LUE ranger flex, circles x 30 reps   Shoulder Exercises: ROM/Strengthening   UBE (Upper Arm Bike) 90 RPM x6 min   Wall Pushups 20 reps   Other ROM/Strengthening Exercises Wall walks yellow theraband 5' x3 RT   Modalities   Modalities Vasopneumatic    Vasopneumatic   Number Minutes Vasopneumatic  15 minutes   Vasopnuematic Location  Shoulder   Vasopneumatic Pressure Low   Vasopneumatic Temperature  50                     PT Long Term Goals - 05/04/16 1418    PT LONG TERM GOAL #1   Title Ind with an advanced HEP.   Time 8   Period Weeks   Status Achieved   PT LONG TERM GOAL #2   Title Active left shoulder flexion to 150 degrees so the patient can easily reach overhead   Time 8   Period Weeks   Status On-going  AROM L shoulder flexion 145 deg 05/04/2016   PT LONG TERM GOAL #3   Title Active ER to 70 degrees+ to allow for easily donning/doffing of apparel   Time 8   Period Weeks   Status On-going   PT LONG TERM GOAL #4   Title Increase ROM so patient is able to reach behind back to L3.   Time 8   Period Weeks   Status On-going   PT LONG TERM GOAL #5   Title Increase left shoulder strength to a solid 4+/5 to increase stability for performance of functional activities   Time 8   Period Weeks   Status On-going   PT LONG TERM GOAL #6   Title Perform ADL's with pain not > 3/10.   Time 8   Period Weeks   Status On-going               Plan - 05/04/16 1419    Clinical Impression Statement Patient tolerated today's treatment well with overall decreased pain reports in both elbow and shoulder. Patient continues to experience some L elbow discomfort with exercises such as rows in both positions but decreased L anterior shoulder pain per patient report. Patient able to tolerate increased repititions and resistance today without report of difficulty. Patient noted that she could tell she was improving as she did not experience as much L shoulder pain with sidelying ER and IR as she did previously. AROM of L shoulder flexion has improved to 145 deg in standing. Normal vasopneumatic response noted following removal of the modalities. Patient denied pain only L shoulder soreness upon end of treatment.   Rehab Potential  Excellent   PT Frequency 2x / week   PT Duration 8 weeks   PT Treatment/Interventions ADLs/Self Care Home Management;Cryotherapy;Electrical Stimulation;Moist Heat;Therapeutic exercise;Therapeutic activities;Patient/family education;Manual techniques;Passive range of motion   PT Next Visit Plan Continue per MPT POC and total shoulder replacement protocol with vasopneumatic device for pain.   PT Home Exercise Plan HEP- resisted ER/IR/Row/Ext with red theraband   Consulted and Agree with Plan of Care Patient      Patient will benefit  from skilled therapeutic intervention in order to improve the following deficits and impairments:  Decreased activity tolerance, Pain, Decreased range of motion, Decreased strength  Visit Diagnosis: Pain in left shoulder  Stiffness of left shoulder, not elsewhere classified     Problem List Patient Active Problem List   Diagnosis Date Noted  . Osteoarthritis of left shoulder 01/30/2016  . 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  Current functional level related to goals / functional outcomes: See above.   Remaining deficits: See below.   Education / Equipment: HEP. Plan: Patient agrees to discharge.  Patient goals were not met. Patient is being discharged due to not returning since the last visit.  ?????        Mali Applegate MPT

## 2016-05-06 ENCOUNTER — Encounter: Payer: Self-pay | Admitting: Physical Therapy

## 2017-11-15 IMAGING — CT CT SHOULDER*L* W/O CM
2 series · 14 of 20 positions shown, 17 images · non-contrast
Comparison: None.

CLINICAL DATA: Preoperative examination. Patient for left shoulder
replacement. History of trauma 13 months ago. Initial encounter.

EXAM:
CT OF THE LEFT SHOULDER WITHOUT CONTRAST
TECHNIQUE: Multidetector CT imaging was performed according to the standard
protocol. Multiplanar CT image reconstructions were also generated.

[Series 2: soft tissue · axial · 0.52mm/px · z∈[+654,+798]mm · 11 of 58 slices shown, 14 images]
[im 5/58  soft-tissue]
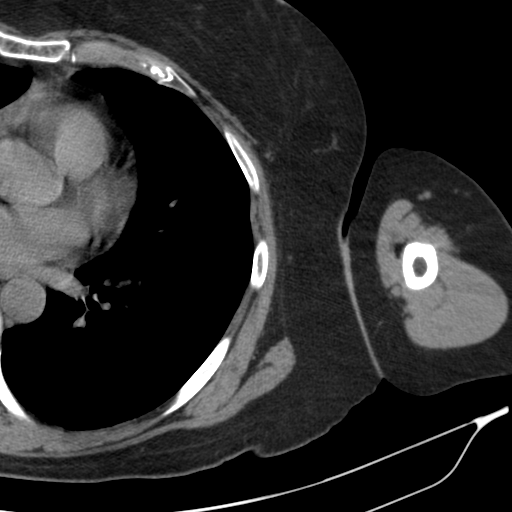
[im 5/58  bone]
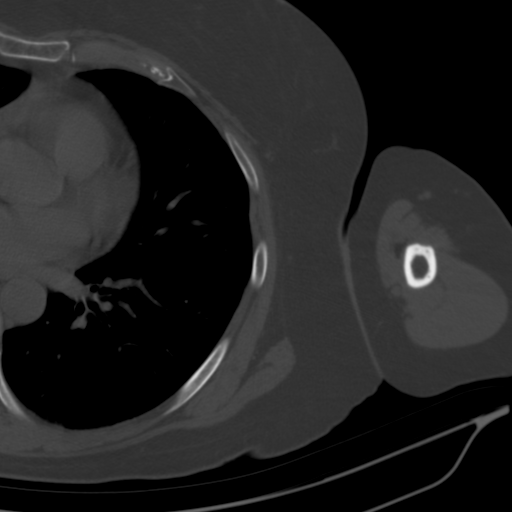
[im 9/58  bone]
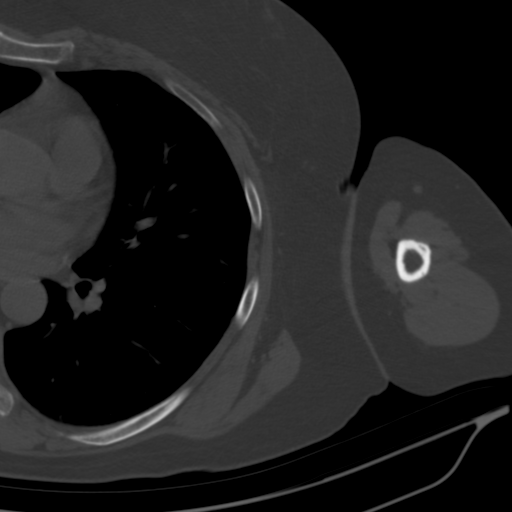
[im 14/58  bone]
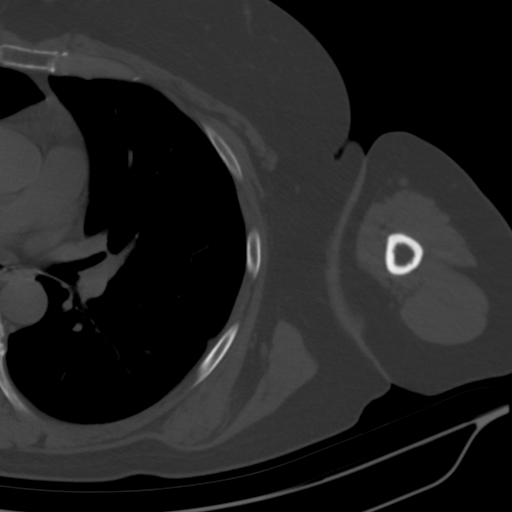
[im 18/58  bone]
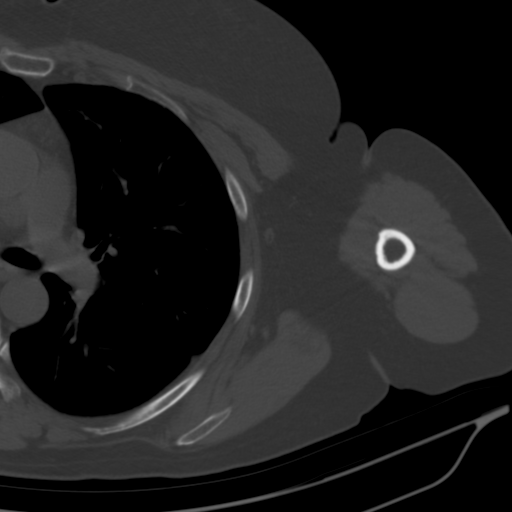
[im 22/58  soft-tissue]
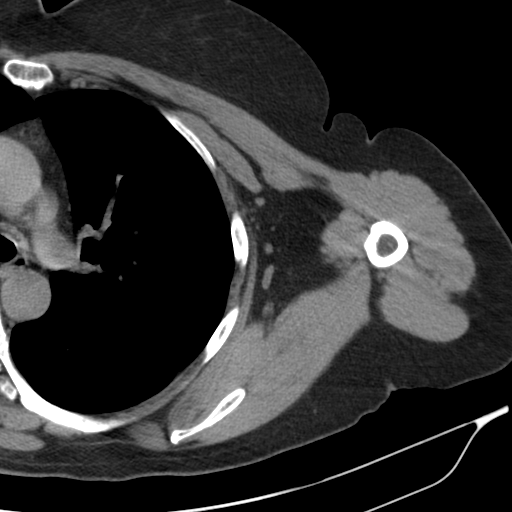
[im 22/58  bone]
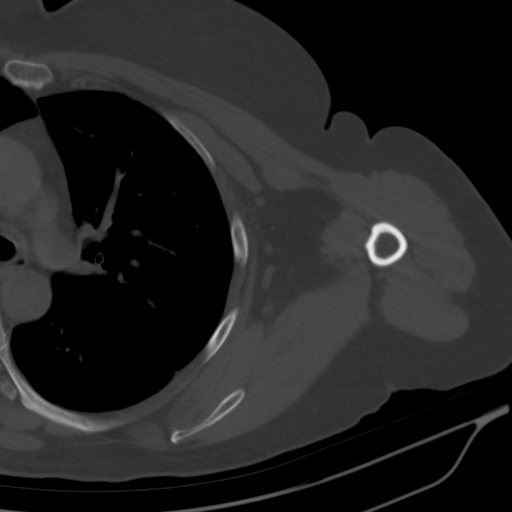
[im 31/58  bone]
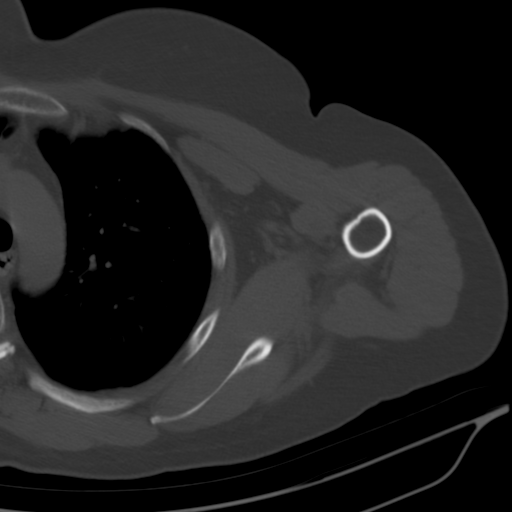
[im 36/58  bone]
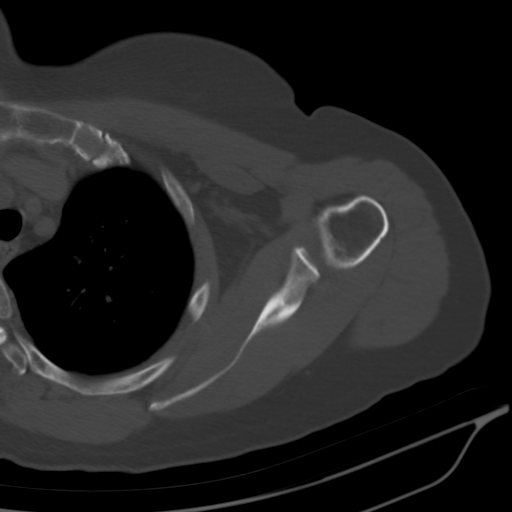
[im 40/58  bone]
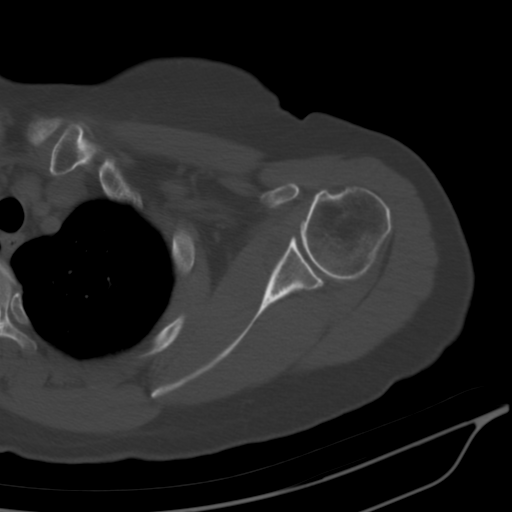
[im 44/58  soft-tissue]
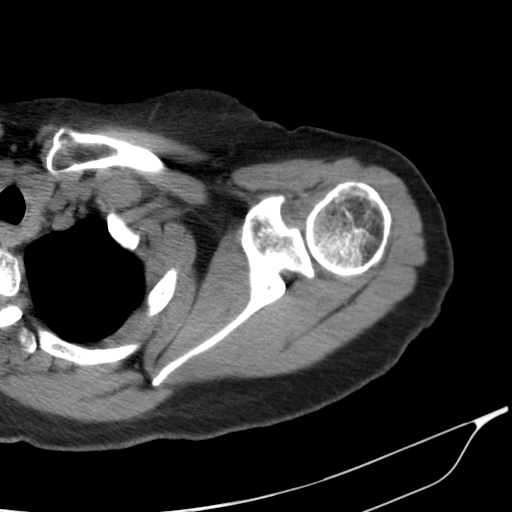
[im 44/58  bone]
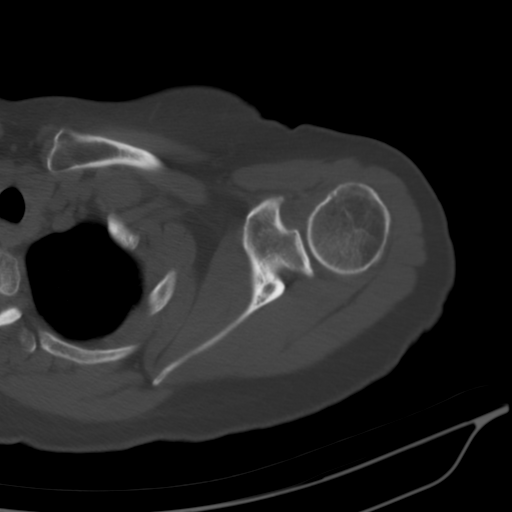
[im 49/58  bone]
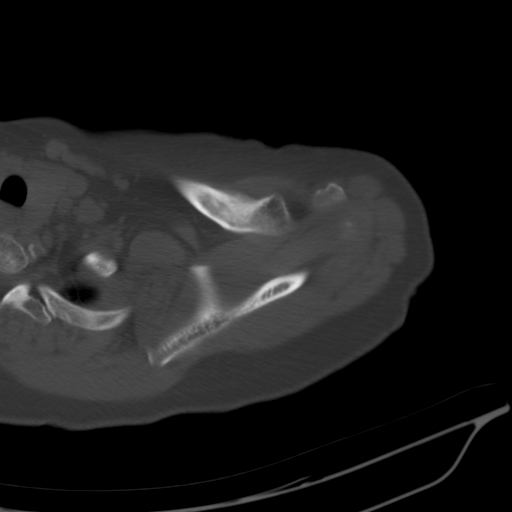
[im 53/58  bone]
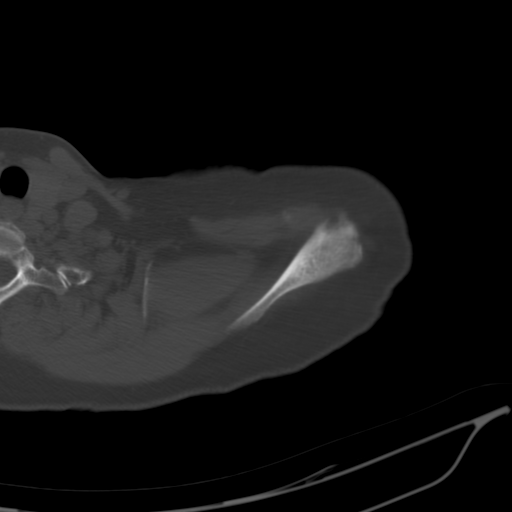

[Series 6: cor bone · coronal · 0.35mm/px · 3 of 59 slices shown]
[im 12/59  bone]
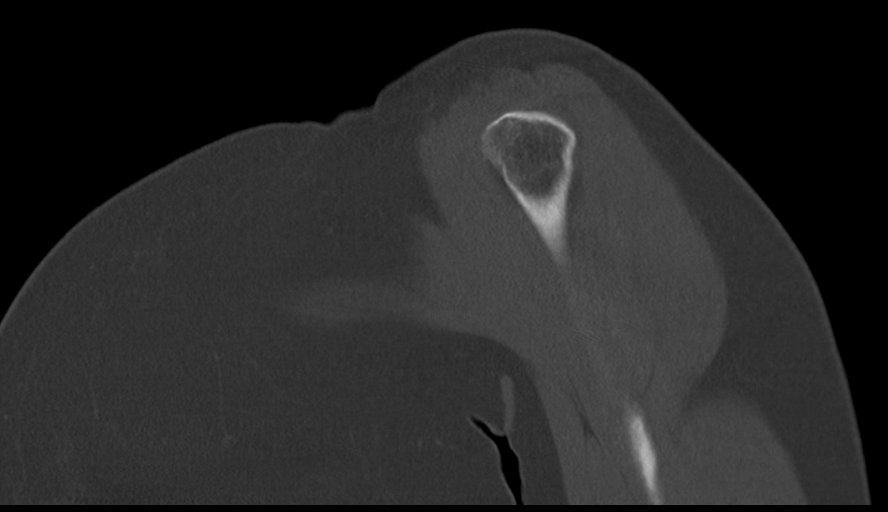
[im 24/59  bone]
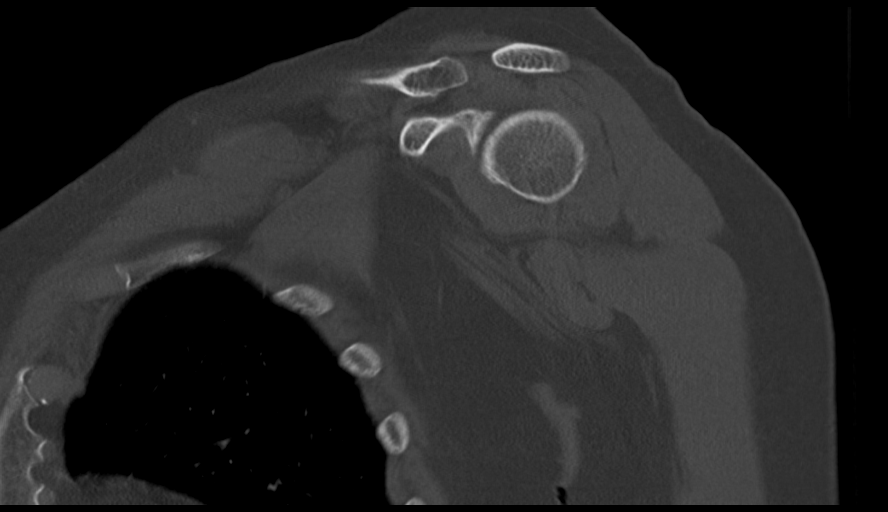
[im 35/59  bone]
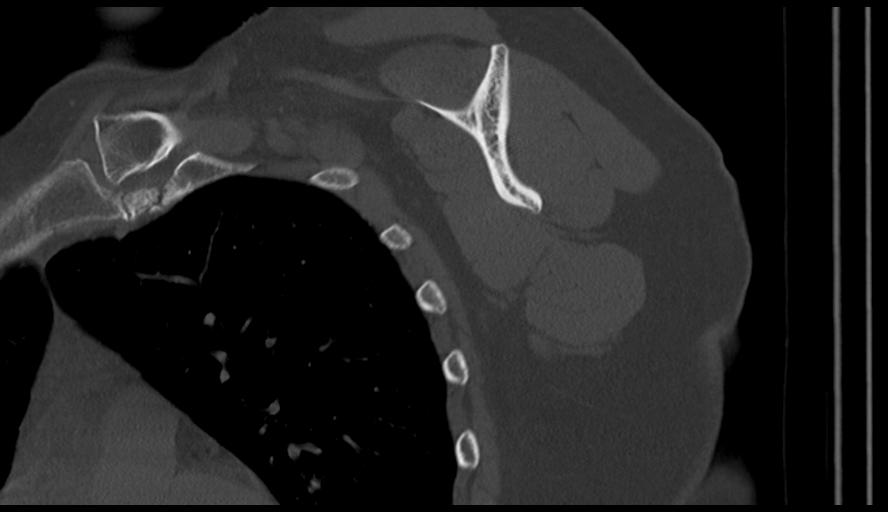

[14 of 20 positions shown; findings below may reference images not displayed]

FINDINGS: The humerus is located. Mild osteophytosis is seen off the inferior
margin of the glenoid and medial humeral head. No subchondral cyst
formation is present about the joint. There appears to be a surgical
anchor in the proximal diaphysis of the humerus. No worrisome bony
lesion is seen. There is only mild acromioclavicular osteoarthritis.
The acromion is type 1. The rotator cuff appears intact. Imaged lung
parenchyma demonstrates some emphysematous change. A ground-glass
attenuating nodule measuring 0.7 cm in diameter is seen in the right
upper lobe on image 19.
IMPRESSION: No acute abnormality. Mild appearing glenohumeral degenerative
change.

Ground-glass attenuating nodule right upper lobe. Follow-up chest CT
scan in 3 months is recommended to ensure resolution.
